# Patient Record
Sex: Female | Born: 1940 | Race: White | Hispanic: No | Marital: Married | Smoking: Never smoker
Health system: Southern US, Community
[De-identification: ages and names within clinical notes are randomized; demographics above are authoritative.]

## PROBLEM LIST (undated history)

## (undated) DIAGNOSIS — E785 Hyperlipidemia, unspecified: Secondary | ICD-10-CM

## (undated) DIAGNOSIS — I10 Essential (primary) hypertension: Secondary | ICD-10-CM

## (undated) HISTORY — PX: HEMORROIDECTOMY: SUR656

## (undated) HISTORY — PX: CHOLECYSTECTOMY: SHX55

---

## 2009-11-11 ENCOUNTER — Ambulatory Visit: Payer: Self-pay | Admitting: Emergency Medicine

## 2009-11-11 DIAGNOSIS — I10 Essential (primary) hypertension: Secondary | ICD-10-CM | POA: Insufficient documentation

## 2009-11-11 DIAGNOSIS — E785 Hyperlipidemia, unspecified: Secondary | ICD-10-CM

## 2010-02-20 ENCOUNTER — Ambulatory Visit
Admission: RE | Admit: 2010-02-20 | Discharge: 2010-02-20 | Payer: Self-pay | Source: Home / Self Care | Admitting: Family Medicine

## 2010-03-18 NOTE — Assessment & Plan Note (Signed)
Summary: R ear pain x 4 dys rm 5   Vital Signs:  Patient Profile:   70 Years Old Female CC:      R ear pain x 4 dys Height:     61.5 inches Weight:      159 pounds O2 Sat:      100 % O2 treatment:    Room Air Temp:     98.5 degrees F oral Pulse rate:   60 / minute Pulse rhythm:   regular Resp:     16 per minute BP sitting:   155 / 91  (left arm) Cuff size:   regular  Vitals Entered By: Areta Haber CMA (November 11, 2009 4:54 PM)                  Current Allergies (reviewed today): ! CODEINE      History of Present Illness Chief Complaint: R ear pain x 4 dys History of Present Illness: Patient complains of onset of cold symptoms and R ear pain for 4 days.  They have been using OTC antihistamine which is helping a little bit.  She is getting ready to fly to [place], then [place] and is concerned. 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  Eyes       Denies change in vision, eye pain, eye discharge, glasses, contact lenses, and eye surgery. Ear/Nose/Throat/Mouth       Complains of ear pain.      Denies hearing loss/aids, change  in hearing, ear discharge, dizziness, frequent runny nose, frequent nose bleeds, sinus problems, sore throat, hoarseness, and tooth pain or bleeding.      Comments: R x 4 dys Respiratory       Denies dry cough, productive cough, wheezing, shortness of breath, asthma, bronchitis, and emphysema/COPD.  Cardiovascular       Denies murmurs, chest pain, and tires easily with exhertion.    Gastrointestinal       Denies stomach pain, nausea/vomiting, diarrhea, constipation, blood in bowel movements, and indigestion. Genitourniary       Denies painful urination, kidney stones, and loss of urinary control. Neurological       Denies paralysis, seizures, and fainting/blackouts. Musculoskeletal       Denies muscle pain, joint pain, joint stiffness, decreased range of motion, redness, swelling, muscle weakness, and gout.  Skin       Denies bruising, unusual mles/lumps or sores, and hair/skin or nail changes.  Psych       Denies mood changes, temper/anger issues, anxiety/stress, speech problems, depression, and sleep problems. Other Comments: Pt has not seen her PCP for this.   Past History:  Past Medical History: Hyperlipidemia  Hypertension  Past Surgical History: Cholecystectomy Hemorrhoidectomy  Family History: Family History High cholesterol Family History Hypertension  Social History: Married Never Smoked Alcohol use-no Drug use-no Regular exercise-yes Smoking Status:  never Drug Use:  no Does Patient Exercise:  yes Physical Exam General appearance: well developed, well nourished, no acute distress Ears: bilateral ears slightly erythema behind TM, no pus, canals are normal Nasal: mucosa pink, nonedematous, no septal deviation, turbinates normal Oral/Pharynx: tongue normal, posterior pharynx without erythema or exudate Chest/Lungs: no rales, wheezes, or rhonchi bilateral, breath sounds equal without effort Heart: regular rate and  rhythm, no murmur Skin: no obvious rashes or  lesions MSE: oriented to time, place, and person Assessment New Problems: 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  Plan New Medications/Changes: AMOXICILLIN 875 MG TABS (AMOXICILLIN) 1 tab by mouth two times a day for 7 days  #14 x 0, 11/11/2009, Hoyt Koch MD  New Orders: New Patient Level II [phone] Planning Comments:   Consider an OTC decongestant, but caution because it may raise your blood pressure Hydration Chew gum while flying to help the Eustacian tube open up easier   The patient and/or caregiver has been counseled thoroughly with regard to medications prescribed including dosage, schedule, interactions, rationale for use, and possible side effects and they verbalize understanding.  Diagnoses and expected course of recovery discussed and will return if not improved as expected or if the condition worsens. Patient and/or caregiver verbalized understanding.  Prescriptions: AMOXICILLIN 875 MG TABS (AMOXICILLIN) 1 tab by mouth two times a day for 7 days  #14 x 0   Entered and Authorized by:   Hoyt Koch MD   Signed by:   Hoyt Koch MD on 11/11/2009   Method used:   Print then Give to Patient   RxID:   [id number]   Orders Added: 1)  New Patient Level II [95621]

## 2010-03-20 NOTE — Assessment & Plan Note (Signed)
Summary: FLUID IN EARS,RUNNY NOSE,SINUS PROBLEMS,COUGH/WSE (rm 4)   Vital Signs:  Patient Profile:   70 Years Old Female CC:      ear pain, congestion, cough x 1 week Height:     61.5 inches Weight:      159 pounds O2 Sat:      96 % O2 treatment:    Room Air Temp:     98.4 degrees F oral Pulse rate:   76 / minute Resp:     16 per minute BP sitting:   133 / 87  (left arm) Cuff size:   regular  Vitals Entered By: Lajean Saver RN (February 20, 2010 6:43 PM)                  Updated Prior Medication List: BENAZEPRIL HCL 40 MG TABS (BENAZEPRIL HCL) 1 tab by mouth once daily CARVEDILOL 25 MG TABS (CARVEDILOL) 2 tabs by mouth once daily SIMVASTATIN 20 MG TABS (SIMVASTATIN) 1 tab by mouth once daily ASPIRIN 81 MG TBEC (ASPIRIN) 1 tab by mouth once daily  Current Allergies (reviewed today): ! CODEINEHistory of Present Illness Chief Complaint: ear pain, congestion, cough x 1 week History of Present Illness:  Subjective: Patient complains of URI symptoms that started 6 days ago. + mild "scratchy" throat now improved + mild cough for two days No pleuritic pain No wheezing + nasal congestion + post-nasal drainage No sinus pain/pressure No itchy/red eyes ? right earache No hemoptysis No SOB No fever/chills + nausea No vomiting No abdominal pain No diarrhea No skin rashes + fatigue No myalgias No headache Used OTC meds without relief   REVIEW OF SYSTEMS Constitutional Symptoms       Complains of fatigue.     Denies fever, chills, night sweats, weight loss, and weight gain.  Eyes       Denies change in vision, eye pain, eye discharge, glasses, contact lenses, and eye surgery. Ear/Nose/Throat/Mouth       Complains of ear pain, frequent runny nose, frequent nose bleeds, and sinus problems.      Denies hearing loss/aids, change in hearing, ear discharge, dizziness, sore throat, hoarseness, and tooth pain or bleeding.      Comments: both ears Respiratory  Complains of dry cough.      Denies productive cough, wheezing, shortness of breath, asthma, bronchitis, and emphysema/COPD.  Cardiovascular       Denies murmurs, chest pain, and tires easily with exhertion.    Gastrointestinal       Denies stomach pain, nausea/vomiting, diarrhea, constipation, blood in bowel movements, and indigestion. Genitourniary       Denies painful urination, blood or discharge from vagina, kidney stones, and loss of urinary control. Neurological       Denies paralysis, seizures, and fainting/blackouts. Musculoskeletal       Denies muscle pain, joint pain, joint stiffness, decreased range of motion, redness, swelling, muscle weakness, and gout.  Skin       Denies bruising, unusual mles/lumps or sores, and hair/skin or nail changes.  Psych       Denies mood changes, temper/anger issues, anxiety/stress, speech problems, depression, and sleep problems.  Past History:  Past Medical History: Reviewed history from 11/11/2009 and no changes required. Hyperlipidemia Hypertension  Past Surgical History: Reviewed history from 11/11/2009 and no changes required. Cholecystectomy Hemorrhoidectomy  Family History: Reviewed history from 11/11/2009 and no changes required. Family History High cholesterol Family History Hypertension  Social History: Reviewed history from 11/11/2009 and no changes required.  Married Never Smoked Alcohol use-no Drug use-no Regular exercise-yes   Objective:  Appearance:  Patient appears healthy, stated age, and in no acute distress  Eyes:  Pupils are equal, round, and reactive to light and accomdation.  Extraocular movement is intact.  Conjunctivae are not inflamed.  Ears:  Canals normal.  Tympanic membranes normal.   Nose:  Normal septum.  Normal turbinates, mildly congested.   No sinus tenderness present.  Pharynx:  Normal  Neck:  Supple.  Slightly tender shotty posterior nodes are palpated bilaterally.  Lungs:  Clear to  auscultation.  Breath sounds are equal.  Heart:  Regular rate and rhythm without murmurs, rubs, or gallops.  Abdomen:  Nontender without masses or hepatosplenomegaly.  Bowel sounds are present.  No CVA or flank tenderness.  Extremities:  No edema.   Skin:  No rash Assessment New Problems: UPPER RESPIRATORY INFECTION, ACUTE (ICD-465.9)  NO EVIDENCE BACTERIAL INFECTION TODAY  Plan New Medications/Changes: BENZONATATE 200 MG CAPS (BENZONATATE) One by mouth hs as needed cough  #12 x 0, 02/20/2010, Donna Christen MD AMOXICILLIN 875 MG TABS (AMOXICILLIN) One by mouth two times a day (Rx void after 02/28/10)  #14 x 0, 02/20/2010, Donna Christen MD  New Orders: Est. Patient Level III [19147] Pulse Oximetry (single measurment) [82956] Planning Comments:   Treat symptomatically for now:  Increase fluid intake, begin expectorant, topical decongestant, cough suppressant at bedtime.  If fever/chills/sweats persist, or if not improving 5 to 7 days begin amoxicillin (given Rx to hold).  Followup with PCP if not improving 10 to 14 days.   The patient and/or caregiver has been counseled thoroughly with regard to medications prescribed including dosage, schedule, interactions, rationale for use, and possible side effects and they verbalize understanding.  Diagnoses and expected course of recovery discussed and will return if not improved as expected or if the condition worsens. Patient and/or caregiver verbalized understanding.  Prescriptions: BENZONATATE 200 MG CAPS (BENZONATATE) One by mouth hs as needed cough  #12 x 0   Entered and Authorized by:   Donna Christen MD   Signed by:   Donna Christen MD on 02/20/2010   Method used:   Print then Give to Patient   RxID:   [id number] AMOXICILLIN 875 MG TABS (AMOXICILLIN) One by mouth two times a day (Rx void after 02/28/10)  #14 x 0   Entered and Authorized by:   Donna Christen MD   Signed by:   Donna Christen MD on 02/20/2010   Method used:   Print then  Give to Patient   RxID:   [id number]   Patient Instructions: 1)  May use Mucinex  (guaifenesin) twice daily for congestion. 2)  Increase fluid intake, rest. 3)  May use Afrin nasal spray (or generic oxymetazoline) twice daily for about 5 days.  Also recommend using saline nasal spray several times daily and/or saline nasal irrigation. 4)  Begin amoxicillin if not improving 5 to 7 days or if persistent fever develops 5)  Followup with family doctor if not improving 10 to 12 days.  Orders Added: 1)  Est. Patient Level III [66440] 2)  Pulse Oximetry (single measurment) [34742]

## 2011-11-08 ENCOUNTER — Encounter: Payer: Self-pay | Admitting: *Deleted

## 2011-11-08 ENCOUNTER — Emergency Department (INDEPENDENT_AMBULATORY_CARE_PROVIDER_SITE_OTHER)
Admission: EM | Admit: 2011-11-08 | Discharge: 2011-11-08 | Disposition: A | Discharge status: Home / Self Care | Payer: Medicare Other | Source: Home / Self Care | Attending: Family Medicine | Admitting: Family Medicine

## 2011-11-08 DIAGNOSIS — N3000 Acute cystitis without hematuria: Secondary | ICD-10-CM

## 2011-11-08 HISTORY — DX: Essential (primary) hypertension: I10

## 2011-11-08 HISTORY — DX: Hyperlipidemia, unspecified: E78.5

## 2011-11-08 LAB — POCT URINALYSIS DIP (MANUAL ENTRY)
Glucose, UA: 100
Nitrite, UA: POSITIVE
Spec Grav, UA: <=1.005
Urobilinogen, UA: 0.2
pH, UA: 6

## 2011-11-08 MED ORDER — CEPHALEXIN 500 MG PO CAPS: 500.0000 mg | ORAL_CAPSULE | Freq: Two times a day (BID) | ORAL | Status: DC

## 2011-11-08 MED ORDER — PHENAZOPYRIDINE HCL 200 MG PO TABS: 200.0000 mg | ORAL_TABLET | Freq: Three times a day (TID) | ORAL | Status: DC

## 2011-11-08 NOTE — ED Notes (Signed)
Pt complains of dysuria, urinary frequency and pressure starting today.  Has taken otc AZO

## 2011-11-08 NOTE — ED Provider Notes (Signed)
History     CSN: [id number]  Arrival date & time 11/08/11  1547   First MD Initiated Contact with Patient 11/08/11 1639      Chief Complaint  Patient presents with  . Dysuria     HPI Comments: Pt complains of dysuria, urinary frequency and pressure starting today.  Has taken otc AZO  Patient is a 71 y.o. female presenting with dysuria. The history is provided by the patient.  Dysuria  This is a new problem. The current episode started 6 to 12 hours ago. The problem occurs every urination. The problem has been gradually worsening. The quality of the pain is described as burning. The pain is mild. There has been no fever. Associated symptoms include frequency, hesitancy and urgency. Pertinent negatives include no chills, no sweats, no nausea, no vomiting, no discharge, no hematuria and no flank pain.    Past Medical History  Diagnosis Date  . Hypertension   . Hyperlipemia     Past Surgical History  Procedure Date  . Hemorroidectomy   . Cholecystectomy     History reviewed. No pertinent family history.  History  Substance Use Topics  . Smoking status: Never Smoker   . Smokeless tobacco: Never Used  . Alcohol Use: No    OB History    Grav Para Term Preterm Abortions TAB SAB Ect Mult Living                  Review of Systems  Constitutional: Negative for chills.  Gastrointestinal: Negative for nausea and vomiting.  Genitourinary: Positive for dysuria, hesitancy, urgency and frequency. Negative for hematuria and flank pain.  All other systems reviewed and are negative.    Allergies  Codeine  Home Medications   Current Outpatient Rx  Name Route Sig Dispense Refill  . AMLODIPINE BESYLATE 2.5 MG PO TABS Oral Take 2.5 mg by mouth daily.    . ASPIRIN 81 MG PO TABS Oral Take 81 mg by mouth daily.    Marland Kitchen BENAZEPRIL HCL 40 MG PO TABS Oral Take 40 mg by mouth daily.    Marland Kitchen CARVEDILOL 25 MG PO TABS Oral Take 25 mg by mouth 2 (two) times daily with a meal.    .  SIMVASTATIN 20 MG PO TABS Oral Take 20 mg by mouth every evening.    . CEPHALEXIN 500 MG PO CAPS Oral Take 1 capsule (500 mg total) by mouth 2 (two) times daily. 14 capsule 0  . PHENAZOPYRIDINE HCL 200 MG PO TABS Oral Take 1 tablet (200 mg total) by mouth 3 (three) times daily. 6 tablet 0    BP 147/84  Pulse 64  Temp 97.9 F (36.6 C) (Oral)  Resp 14  Ht 5' 0.5" (1.537 m)  Wt 166 lb 12 oz (75.637 kg)  BMI 32.03 kg/m2  SpO2 96%  Physical Exam Nursing notes and Vital Signs reviewed. Appearance:  Patient appears healthy, stated age, and in no acute distress Eyes:  Pupils are equal, round, and reactive to light and accomodation.  Extraocular movement is intact.  Conjunctivae are not inflamed  Pharynx:  Normal Neck:  Supple.  No adenopathy Lungs:  Clear to auscultation.  Breath sounds are equal.  Heart:  Regular rate and rhythm without murmurs, rubs, or gallops.  Abdomen:  Nontender without masses or hepatosplenomegaly.  Bowel sounds are present.  No CVA or flank tenderness.  Extremities:  No edema.  No calf tenderness Skin:  No rash present.   ED Course  Procedures  none   Labs Reviewed  POCT URINALYSIS DIP (MANUAL ENTRY) Gluc 100mg /dL; Blood large; Prot trace; Nit positive; leuks large  URINE CULTURE pending      1. Acute cystitis       MDM  Urine culture pending. Begin Keflex, and Pyridium Increase fluid intake. Followup with Family Doctor if not improved in one week.         Lattie Haw, MD 11/13/11 1025

## 2011-11-14 ENCOUNTER — Telehealth: Payer: Self-pay | Admitting: Family Medicine

## 2012-06-16 ENCOUNTER — Encounter: Payer: Self-pay | Admitting: *Deleted

## 2012-06-16 ENCOUNTER — Emergency Department (INDEPENDENT_AMBULATORY_CARE_PROVIDER_SITE_OTHER)
Admission: EM | Admit: 2012-06-16 | Discharge: 2012-06-16 | Disposition: A | Discharge status: Home / Self Care | Payer: Medicare Other | Source: Home / Self Care | Attending: Family Medicine | Admitting: Family Medicine

## 2012-06-16 DIAGNOSIS — J029 Acute pharyngitis, unspecified: Secondary | ICD-10-CM

## 2012-06-16 DIAGNOSIS — J069 Acute upper respiratory infection, unspecified: Secondary | ICD-10-CM

## 2012-06-16 MED ORDER — BENZONATATE 200 MG PO CAPS: 200.0000 mg | ORAL_CAPSULE | Freq: Every day | ORAL | Status: DC

## 2012-06-16 MED ORDER — AMOXICILLIN 875 MG PO TABS: 875.0000 mg | ORAL_TABLET | Freq: Two times a day (BID) | ORAL | Status: DC

## 2012-06-16 NOTE — ED Provider Notes (Signed)
History     CSN: [id number]  Arrival date & time 06/16/12  1526   First MD Initiated Contact with Patient 06/16/12 1550      Chief Complaint  Patient presents with  . Facial Pain       HPI Comments: Patient complains of onset of mild sore throat two days ago that has persisted.  She next developed sinus congestion, and has just developed a cough.  She has noted facial discomfort today.  She has been fatigued, but no fevers, chills, and sweats   The history is provided by the patient.    Past Medical History  Diagnosis Date  . Hypertension   . Hyperlipemia     Past Surgical History  Procedure Laterality Date  . Hemorroidectomy    . Cholecystectomy      Family History  Problem Relation Age of Onset  . Heart attack Mother   . Heart attack Father   . Cancer Sister     breast  . Cancer Brother     lung    History  Substance Use Topics  . Smoking status: Never Smoker   . Smokeless tobacco: Never Used  . Alcohol Use: No    OB History   Grav Para Term Preterm Abortions TAB SAB Ect Mult Living                  Review of Systems + sore throat + cough No pleuritic pain No wheezing + nasal congestion + post-nasal drainage + sinus pain/pressure No itchy/red eyes ? earache No hemoptysis No SOB No fever/chills No nausea No vomiting No abdominal pain No diarrhea No urinary symptoms No skin rashes + fatigue No myalgias + headache Used OTC meds without relief  Allergies  Codeine  Home Medications   Current Outpatient Rx  Name  Route  Sig  Dispense  Refill  . aspirin 81 MG tablet   Oral   Take 81 mg by mouth daily.         Marland Kitchen atorvastatin (LIPITOR) 40 MG tablet   Oral   Take 40 mg by mouth daily.         . benazepril (LOTENSIN) 40 MG tablet   Oral   Take 40 mg by mouth daily.         . carvedilol (COREG) 25 MG tablet   Oral   Take 25 mg by mouth 2 (two) times daily with a meal.         . clopidogrel (PLAVIX) 75 MG tablet   Oral   Take 75 mg by mouth daily.         . phenazopyridine (PYRIDIUM) 200 MG tablet   Oral   Take 1 tablet (200 mg total) by mouth 3 (three) times daily.   6 tablet   0   . simvastatin (ZOCOR) 20 MG tablet   Oral   Take 20 mg by mouth every evening.         Marland Kitchen amoxicillin (AMOXIL) 875 MG tablet   Oral   Take 1 tablet (875 mg total) by mouth 2 (two) times daily. (Rx void after 06/24/12)   20 tablet   0   . benzonatate (TESSALON) 200 MG capsule   Oral   Take 1 capsule (200 mg total) by mouth at bedtime.   12 capsule   0     BP 131/85  Pulse 75  Temp(Src) 98.4 F (36.9 C) (Oral)  Resp 14  Ht 5\' 1"  (1.549 m)  Wt  162 lb (73.483 kg)  BMI 30.63 kg/m2  SpO2 95%  Physical Exam Nursing notes and Vital Signs reviewed. Appearance:  Patient appears healthy, stated age, and in no acute distress Eyes:  Pupils are equal, round, and reactive to light and accomodation.  Extraocular movement is intact.  Conjunctivae are not inflamed  Ears:  Canals normal.  Tympanic membranes normal.  Nose:  Mildly congested turbinates.   Mild maxillary sinus tenderness is present.  Pharynx:   Mildly erythematous posteriorly Neck:  Supple.  Slightly tender shotty posterior nodes are palpated bilaterally  Lungs:  Clear to auscultation.  Breath sounds are equal.  Heart:  Regular rate and rhythm without murmurs, rubs, or gallops.  Abdomen:  Nontender without masses or hepatosplenomegaly.  Bowel sounds are present.  No CVA or flank tenderness.  Extremities:  No edema.  No calf tenderness Skin:  No rash present.   ED Course  Procedures  none  Labs Reviewed  POCT RAPID STREP A (OFFICE) negative      1. Acute upper respiratory infections of unspecified site   2. Acute pharyngitis       MDM  There is no evidence of bacterial infection today.  Throat culture pending. Treat symptomatically for now.  Prescription written for Benzonatate [facility]) to take at bedtime for night-time cough.  Take plain  Mucinex (guaifenesin) twice daily for cough and congestion.  Increase fluid intake, rest. May use Afrin nasal spray (or generic oxymetazoline) twice daily for about 5 days.  Also recommend using saline nasal spray several times daily and saline nasal irrigation (AYR is a common brand) Stop all antihistamines for now, and other non-prescription cough/cold preparations. Begin Amoxicillin if not improving about one week or if persistent fever develops (Given a prescription to hold, with an expiration date)  Follow-up with family doctor if not improving about 10 days.         Lattie Haw, MD 06/16/12 [phone]

## 2012-06-16 NOTE — ED Notes (Signed)
Sheena Allen c/o sinus pain, congestion, HA and runny nose x 3 days. Denies fever. NO otc meds taken.

## 2014-07-21 ENCOUNTER — Emergency Department (INDEPENDENT_AMBULATORY_CARE_PROVIDER_SITE_OTHER)
Admission: EM | Admit: 2014-07-21 | Discharge: 2014-07-21 | Disposition: A | Discharge status: Home / Self Care | Payer: Medicare Other | Source: Home / Self Care | Attending: Sports Medicine | Admitting: Sports Medicine

## 2014-07-21 ENCOUNTER — Encounter: Payer: Self-pay | Admitting: Emergency Medicine

## 2014-07-21 DIAGNOSIS — N3090 Cystitis, unspecified without hematuria: Secondary | ICD-10-CM | POA: Diagnosis not present

## 2014-07-21 MED ORDER — CEPHALEXIN 500 MG PO CAPS: 500.0000 mg | ORAL_CAPSULE | Freq: Two times a day (BID) | ORAL | Status: DC

## 2014-07-21 NOTE — ED Provider Notes (Signed)
  Subjective:    CC: Dysuria  HPI: This is an extremely pleasant 74 year old female, who comes in with a one-day history of dysuria and urgency, no fevers, chills, night sweats, no flank pain, no visible hematuria. No GI symptoms. No other constitutional symptoms. She has had UTIs before, previously they have grown out pansensitive Escherichia coli. This feels like her typical UTI symptoms.   Past medical history, Surgical history, Family history not pertinant except as noted below, Social history, Allergies, and medications have been entered into the medical record, reviewed, and no changes needed.   Review of Systems: No fevers, chills, night sweats, weight loss, chest pain, or shortness of breath.   Objective:    General: Well Developed, well nourished, and in no acute distress.  Neuro: Alert and oriented x3, extra-ocular muscles intact, sensation grossly intact.  HEENT: Normocephalic, atraumatic, pupils equal round reactive to light, neck supple, no masses, no lymphadenopathy, thyroid nonpalpable.  Skin: Warm and dry, no rashes. Cardiac: Regular rate and rhythm, no murmurs rubs or gallops, no lower extremity edema.  Respiratory: Clear to auscultation bilaterally. Not using accessory muscles, speaking in full sentences. Abdomen: Soft, nontender, nondistended, normal bowel sounds, no palpable masses, no costovertebral angle pain.  Urinalysis deferred due to concurrent Pyridium, urine culture has been sent off.  Impression and Recommendations:     1. Cystitis: Uncomplicated, no allergies, previous urinary tract infections of grown out pansensitive Escherichia coli, treated with Keflex twice a day for 7 days, continue Pyridium. Hydration, return as needed.  Monica Bectonhomas J Thekkekandam, MD 07/21/14 1719

## 2014-07-21 NOTE — ED Notes (Signed)
Pt c/o urinary frequency, urgency and burning that started this am. No back pain denies fever. She is taking azo.

## 2014-07-22 LAB — URINE CULTURE
Colony Count: NO GROWTH
Organism ID, Bacteria: NO GROWTH

## 2014-11-27 ENCOUNTER — Emergency Department (INDEPENDENT_AMBULATORY_CARE_PROVIDER_SITE_OTHER)
Admission: EM | Admit: 2014-11-27 | Discharge: 2014-11-27 | Disposition: A | Discharge status: Home / Self Care | Payer: Medicare Other | Source: Home / Self Care | Attending: Family Medicine | Admitting: Family Medicine

## 2014-11-27 ENCOUNTER — Encounter: Payer: Self-pay | Admitting: *Deleted

## 2014-11-27 DIAGNOSIS — K120 Recurrent oral aphthae: Secondary | ICD-10-CM

## 2014-11-27 DIAGNOSIS — J069 Acute upper respiratory infection, unspecified: Secondary | ICD-10-CM

## 2014-11-27 LAB — POCT RAPID STREP A (OFFICE): RAPID STREP A SCREEN: NEGATIVE

## 2014-11-27 MED ORDER — TRIAMCINOLONE ACETONIDE 0.1 % MT PSTE: PASTE | OROMUCOSAL | Status: AC

## 2014-11-27 MED ORDER — AMOXICILLIN 875 MG PO TABS: 875.0000 mg | ORAL_TABLET | Freq: Two times a day (BID) | ORAL | Status: DC

## 2014-11-27 MED ORDER — BENZONATATE 200 MG PO CAPS: 200.0000 mg | ORAL_CAPSULE | Freq: Every day | ORAL | Status: DC

## 2014-11-27 NOTE — ED Provider Notes (Signed)
CSN: [id number]     Arrival date & time 11/27/14  1500 History   First MD Initiated Contact with Patient 11/27/14 1546     Chief Complaint  Patient presents with  . Facial Swelling  . Nasal Congestion     HPI Comments: Yesterday patient developed sinus congestion and left facial swelling, sore throat, fatigue, myalgias, nausea, and soreness in the roof of her mouth.  No cough.  No fevers, chills, and sweats.  The history is provided by the patient.    Past Medical History  Diagnosis Date  . Hypertension   . Hyperlipemia    Past Surgical History  Procedure Laterality Date  . Hemorroidectomy    . Cholecystectomy     Family History  Problem Relation Age of Onset  . Heart attack Mother   . Heart attack Father   . Cancer Sister     breast  . Cancer Brother     lung   Social History  Substance Use Topics  . Smoking status: Never Smoker   . Smokeless tobacco: Never Used  . Alcohol Use: No   OB History    No data available     Review of Systems  Allergies  Codeine  Home Medications   Prior to Admission medications   Medication Sig Start Date End Date Taking? Authorizing Provider  aspirin 81 MG tablet Take 81 mg by mouth daily.   Yes Historical Provider, MD  atorvastatin (LIPITOR) 40 MG tablet Take 40 mg by mouth daily.   Yes Historical Provider, MD  benazepril (LOTENSIN) 40 MG tablet Take 40 mg by mouth daily.   Yes Historical Provider, MD  clopidogrel (PLAVIX) 75 MG tablet Take 75 mg by mouth daily.   Yes Historical Provider, MD  diltiazem (DILACOR XR) 180 MG 24 hr capsule Take 180 mg by mouth daily.   Yes Historical Provider, MD  simvastatin (ZOCOR) 20 MG tablet Take 20 mg by mouth every evening.   Yes Historical Provider, MD  amoxicillin (AMOXIL) 875 MG tablet Take 1 tablet (875 mg total) by mouth 2 (two) times daily. (Rx void after 12/05/14) 11/27/14   Lattie Haw, MD  benzonatate (TESSALON) 200 MG capsule Take 1 capsule (200 mg total) by mouth at bedtime.  Take as needed for cough 11/27/14   Lattie Haw, MD  triamcinolone (KENALOG) 0.1 % paste Place thin layer on mouth ulcers four times daily 11/27/14   Lattie Haw, MD   Meds Ordered and Administered this Visit  Medications - No data to display  BP 163/107 mmHg  Pulse 76  Temp(Src) 98.3 F (36.8 C) (Oral)  Resp 18  Ht 5' 1.5" (1.562 m)  Wt 162 lb (73.483 kg)  BMI 30.12 kg/m2  SpO2 98% No data found.   Physical Exam Nursing notes and Vital Signs reviewed. Appearance:  Patient appears stated age, and in no acute distress Eyes:  Pupils are equal, round, and reactive to light and accomodation.  Extraocular movement is intact.  Conjunctivae are not inflamed  Ears:  Canals normal.  Tympanic membranes normal.  Nose:  Mildly congested turbinates.  No sinus tenderness.  Mouth:  8mm diameter shallow ulcer with surrounding erythema left hard palate adjacent to gingiva  Pharynx:  Mildly erythematous Neck:  Supple.   Tonsillar nodes are slightly tender but not enlarged; posterior nodes are enlarged and tender bilaterally  Lungs:  Clear to auscultation.  Breath sounds are equal.  Moving air well. Heart:  Regular rate and rhythm without  murmurs, rubs, or gallops.  Abdomen:  Nontender without masses or hepatosplenomegaly.  Bowel sounds are present.  No CVA or flank tenderness.  Extremities:  No edema.  No calf tenderness Skin:  No rash present.   ED Course  Procedures  None    Labs Reviewed  POCT RAPID STREP A (OFFICE) negative    MDM   1. Viral URI, early   2. Aphthous ulcer    Rx for Kenalog in [place].  Prescription written for Benzonatate [facility]) to take at bedtime for night-time cough.  If cough develops, take plain guaifenesin (  extended release tabs such as Mucinex) twice daily, with plenty of water, for cough and congestion. Get adequate rest.    Also recommend using saline nasal spray several times daily and saline nasal irrigation (AYR is a common brand).  May  use Flonase nasal spray each morning after using Afrin nasal spray and saline nasal irrigation. Try warm salt water gargles for sore throat.  Stop all antihistamines for now, and other non-prescription cough/cold preparations. Begin Amoxicillin if not improving about one week or if persistent fever develops (Given a prescription to hold, with an expiration date)  Follow-up with family doctor if not improving about10 days.    Lattie Haw, MD 12/02/14 [phone]

## 2014-11-27 NOTE — Discharge Instructions (Signed)
If cough developes, take plain guaifenesin (  extended release tabs such as Mucinex) twice daily, with plenty of water, for cough and congestion. Get adequate rest.    Also recommend using saline nasal spray several times daily and saline nasal irrigation (AYR is a common brand).  May use Flonase nasal spray each morning after using Afrin nasal spray and saline nasal irrigation. Try warm salt water gargles for sore throat.  Stop all antihistamines for now, and other non-prescription cough/cold preparations. Begin Amoxicillin if not improving about one week or if persistent fever develops (Given a prescription to hold, with an expiration date)  Follow-up with family doctor if not improving about10 days.    Canker Sores Canker sores are small, painful sores that develop inside your mouth. They may also be called aphthous ulcers. You can get canker sores on the inside of your lips or cheeks, on your tongue, or anywhere inside your mouth. You can have just one canker sore or several of them. Canker sores cannot be passed from one person to another (noncontagious). These sores are different than the sores that you may get on the outside of your lips (cold sores or fever blisters). Canker sores usually start as painful red bumps. Then they turn into small white, yellow, or gray ulcers that have red borders. The ulcers may be quite painful. The pain may be worse when you eat or drink. CAUSES The cause of this condition is not known. RISK FACTORS This condition is more likely to develop in:  Women.  People in their teens or 54s.  Women who are having their menstrual period.  People who are under a lot of emotional stress.  People who do not get enough iron or B vitamins.  People who have poor oral hygiene.  People who have an injury inside the mouth. This can happen after having dental work or from chewing something hard. SYMPTOMS Along with the canker sore, symptoms may also  include:  Fever.  Fatigue.  Swollen lymph nodes in your neck. DIAGNOSIS This condition can be diagnosed based on your symptoms. Your health care provider will also examine your mouth. Your health care provider may also do tests if you get canker sores often or if they are very bad. Tests may include:  Blood tests to rule out other causes of canker sores.  Taking swabs from the sore to check for infection.  Taking a small piece of skin from the sore (biopsy) to test it for cancer. TREATMENT Most canker sores clear up without treatment in about 10 days. Home care is usually the only treatment that you will need. Over-the-counter medicines can relieve discomfort.If you have severe canker sores, your health care provider may prescribe:  Numbing ointment to relieve pain.  Vitamins.  Steroid medicines. These may be given as:  Oral pills.  Mouth rinses.  Gels.  Antibiotic mouth rinse. HOME CARE INSTRUCTIONS  Apply, take, or use medicines only as directed by your health care provider. These include vitamins.  If you were prescribed an antibiotic mouth rinse, finish all of it even if you start to feel better.  Until the sores are healed:  Do not drink coffee or citrus juices.  Do not eat spicy or salty foods.  Use a mild, over-the-counter mouth rinse as directed by your health care provider.  Practice good oral hygiene.  Floss your teeth every day.  Brush your teeth with a soft brush twice each day. SEEK MEDICAL CARE IF:  Your symptoms do  not get better after two weeks.  You also have a fever or swollen glands.  You get canker sores often.  You have a canker sore that is getting larger.  You cannot eat or drink due to your canker sores.   This information is not intended to replace advice given to you by your health care provider. Make sure you discuss any questions you have with your health care provider.   Document Released: 05/30/2010 Document Revised:  06/19/2014 Document Reviewed: 01/03/2014 Elsevier Interactive Patient Education [employer]

## 2014-11-27 NOTE — ED Notes (Signed)
Pt c/o LT sided facial swelling, oral swelling, and nasal congestion x 1 day. Denies fever.

## 2015-09-04 ENCOUNTER — Emergency Department (INDEPENDENT_AMBULATORY_CARE_PROVIDER_SITE_OTHER)
Admission: EM | Admit: 2015-09-04 | Discharge: 2015-09-04 | Disposition: A | Discharge status: Home / Self Care | Payer: Medicare Other | Source: Home / Self Care | Attending: Family Medicine | Admitting: Family Medicine

## 2015-09-04 DIAGNOSIS — Z87440 Personal history of urinary (tract) infections: Secondary | ICD-10-CM | POA: Diagnosis not present

## 2015-09-04 DIAGNOSIS — R300 Dysuria: Secondary | ICD-10-CM

## 2015-09-04 MED ORDER — CEPHALEXIN 500 MG PO CAPS: 500.0000 mg | ORAL_CAPSULE | Freq: Two times a day (BID) | ORAL | Status: DC

## 2015-09-04 NOTE — ED Notes (Signed)
Started this afternoon with burning on urination, and bladder pressure.  Denies lower back pain.  Took AZO this afternoon.

## 2015-09-04 NOTE — Discharge Instructions (Signed)
You may continue to take over the counter Azo to help with urinary symptoms. Be sure to stay well hydrated.  Also be sure to follow up with urology, gynecologist, or primary care if you keep having recurrent urinary infections, more than 1 per year.

## 2015-09-04 NOTE — ED Notes (Signed)
BP retaken 177/95

## 2015-09-04 NOTE — ED Provider Notes (Signed)
CSN: [id number]     Arrival date & time 09/04/15  1754 History   First MD Initiated Contact with Patient 09/04/15 1826     Chief Complaint  Patient presents with  . Dysuria   (Consider location/radiation/quality/duration/timing/severity/associated sxs/prior Treatment) HPI Sheena Allen is a 75 y.o. female presenting to UC with c/o sudden onset urinary symptoms including burning with urination, bladder pressure and urgency.  Hx of UTI about 1 year ago with similar symptoms. She did take 1 dose of Azo PTA with mild relief. Denies noticing hematuria prior to taking Azo.  Denies nausea, fever, chills, or low back pain.    BP elevated in triage. Hx of HTN. Denies headache, chest pain or SOB. Notes she typically takes her BP medication in the evening and has not taken that dose yet as she knew she was coming to UC.    Past Medical History  Diagnosis Date  . Hypertension   . Hyperlipemia    Past Surgical History  Procedure Laterality Date  . Hemorroidectomy    . Cholecystectomy     Family History  Problem Relation Age of Onset  . Heart attack Mother   . Heart attack Father   . Cancer Sister     breast  . Cancer Brother     lung   Social History  Substance Use Topics  . Smoking status: Never Smoker   . Smokeless tobacco: Never Used  . Alcohol Use: No   OB History    No data available     Review of Systems  Constitutional: Negative for fever, chills and fatigue.  Gastrointestinal: Positive for abdominal pain ( "bladder pressure"). Negative for nausea, vomiting and diarrhea.  Genitourinary: Positive for dysuria, urgency, frequency and pelvic pain ( "pressure"). Negative for hematuria and flank pain.  Neurological: Negative for dizziness and light-headedness.    Allergies  Codeine  Home Medications   Prior to Admission medications   Medication Sig Start Date End Date Taking? Authorizing Provider  amoxicillin (AMOXIL) 875 MG tablet Take 1 tablet (875 mg total) by mouth 2  (two) times daily. (Rx void after 12/05/14) 11/27/14   Lattie HawStephen A Beese, MD  aspirin 81 MG tablet Take 81 mg by mouth daily.    Historical Provider, MD  atorvastatin (LIPITOR) 40 MG tablet Take 40 mg by mouth daily.    Historical Provider, MD  benazepril (LOTENSIN) 40 MG tablet Take 40 mg by mouth daily.    Historical Provider, MD  benzonatate (TESSALON) 200 MG capsule Take 1 capsule (200 mg total) by mouth at bedtime. Take as needed for cough 11/27/14   Lattie HawStephen A Beese, MD  cephALEXin (KEFLEX) 500 MG capsule Take 1 capsule (500 mg total) by mouth 2 (two) times daily. For 7 days 09/04/15   Junius FinnerErin O'Malley, PA-C  clopidogrel (PLAVIX) 75 MG tablet Take 75 mg by mouth daily.    Historical Provider, MD  diltiazem (DILACOR XR) 180 MG 24 hr capsule Take 180 mg by mouth daily.    Historical Provider, MD  simvastatin (ZOCOR) 20 MG tablet Take 20 mg by mouth every evening.    Historical Provider, MD  triamcinolone (KENALOG) 0.1 % paste Place thin layer on mouth ulcers four times daily 11/27/14   Lattie HawStephen A Beese, MD   Meds Ordered and Administered this Visit  Medications - No data to display  BP 176/104 mmHg  Pulse 70  Temp(Src) 97.7 F (36.5 C) (Oral)  Ht 5' 1.5" (1.562 m)  Wt 169 lb 6.4 oz (76.839  kg)  BMI 31.49 kg/m2  SpO2 96% No data found.   Physical Exam  Constitutional: She appears well-developed and well-nourished. No distress.  HENT:  Head: Normocephalic and atraumatic.  Mouth/Throat: Oropharynx is clear and moist.  Eyes: Conjunctivae are normal. No scleral icterus.  Neck: Normal range of motion.  Cardiovascular: Normal rate, regular rhythm and normal heart sounds.   Pulmonary/Chest: Effort normal and breath sounds normal. No respiratory distress. She has no wheezes. She has no rales.  Abdominal: Soft. She exhibits no distension and no mass. There is no tenderness. There is no rebound, no guarding and no CVA tenderness.  Musculoskeletal: Normal range of motion.  Neurological: She is  alert.  Skin: Skin is warm and dry. She is not diaphoretic.  Nursing note and vitals reviewed.   ED Course  Procedures (including critical care time)  Labs Review Labs Reviewed  URINE CULTURE    Imaging Review No results found.    MDM   1. Dysuria   2. History of UTI    Pt c/o UTI symptoms that started this afternoon, hx of same. Pt started taking Azo PTA.  UA could be altered, will start empiric treatment and send urine culture.  Recheck BP- 177/95. Encouraged to take her BP medication when she gets home and f/u with PCP for monitoring.  Rx: Keflex Encouraged to keep well hydrated. F/u with PCP in 4-5 days if not improving and for recurrent UTIs if becoming more frequent than once a year. Patient verbalized understanding and agreement with treatment plan.     Junius Finner, PA-C 09/04/15 1928

## 2015-09-04 NOTE — ED Notes (Signed)
Bed: KUC3 Expected date:  Expected time:  Means of arrival:  Comments: OCC

## 2015-09-05 LAB — URINE CULTURE

## 2015-09-06 ENCOUNTER — Telehealth: Payer: Self-pay

## 2015-09-06 NOTE — Telephone Encounter (Signed)
Pt stated that she is feeling much better.  Will follow up with PCP if questions or problems.

## 2017-10-22 ENCOUNTER — Other Ambulatory Visit: Payer: Self-pay

## 2017-10-22 ENCOUNTER — Emergency Department (INDEPENDENT_AMBULATORY_CARE_PROVIDER_SITE_OTHER)
Admission: EM | Admit: 2017-10-22 | Discharge: 2017-10-22 | Disposition: A | Discharge status: Home / Self Care | Payer: Medicare Other | Source: Home / Self Care | Attending: Family Medicine | Admitting: Family Medicine

## 2017-10-22 ENCOUNTER — Encounter: Payer: Self-pay | Admitting: Emergency Medicine

## 2017-10-22 DIAGNOSIS — R21 Rash and other nonspecific skin eruption: Secondary | ICD-10-CM | POA: Diagnosis not present

## 2017-10-22 MED ORDER — PREDNISONE 50 MG PO TABS: 50.0000 mg | ORAL_TABLET | Freq: Every day | ORAL | 0 refills | Status: AC

## 2017-10-22 MED ORDER — TRIAMCINOLONE ACETONIDE 0.1 % EX CREA: 1.0000 "application " | TOPICAL_CREAM | Freq: Two times a day (BID) | CUTANEOUS | 0 refills | Status: AC

## 2017-10-22 NOTE — ED Provider Notes (Signed)
[facility]    CSN: [id number] Arrival date & time: 10/22/17  1025     History   Chief Complaint Chief Complaint  Patient presents with  . Rash    HPI Sheena Allen is a 77 y.o. female.   HPI Sheena Allen is a 77 y.o. female presenting to UC with c/o itching red, slightly burning rash to Right upper chest that started 5 days ago.  She had a lumpectomy performed on 10/11/17.  She has since followed up with her surgeon who believes the rash is due to the cleaning agent used on her skin. Pt has been taking benadryl and using cortisone cream with moderate improvement but the rash is still present. Pt wants to make sure it is not shingles. Denies fever, chills, body aches. No bleeding or drainage from the rash.    Past Medical History:  Diagnosis Date  . Hyperlipemia   . Hypertension     Patient Active Problem List   Diagnosis Date Noted  . HYPERLIPIDEMIA 11/11/2009  . HYPERTENSION 11/11/2009    Past Surgical History:  Procedure Laterality Date  . CHOLECYSTECTOMY    . HEMORROIDECTOMY      OB History   None      Home Medications    Prior to Admission medications   Medication Sig Start Date End Date Taking? Authorizing Provider  losartan (COZAAR) 100 MG tablet Take 100 mg by mouth daily.   Yes [provider]  amoxicillin (AMOXIL) 875 MG tablet Take 1 tablet (875 mg total) by mouth 2 (two) times daily. (Rx void after 12/05/14) 11/27/14   Lattie Haw, MD  aspirin 81 MG tablet Take 81 mg by mouth daily.    [provider]  atorvastatin (LIPITOR) 40 MG tablet Take 40 mg by mouth daily.    [provider]  benazepril (LOTENSIN) 40 MG tablet Take 40 mg by mouth daily.    [provider]  benzonatate (TESSALON) 200 MG capsule Take 1 capsule (200 mg total) by mouth at bedtime. Take as needed for cough 11/27/14   Lattie Haw, MD  cephALEXin (KEFLEX) 500 MG capsule Take 1 capsule (500 mg total) by mouth 2 (two) times  daily. For 7 days 09/04/15   Lurene Shadow, PA-C  clopidogrel (PLAVIX) 75 MG tablet Take 75 mg by mouth daily.    [provider]  diltiazem (DILACOR XR) 180 MG 24 hr capsule Take 180 mg by mouth daily.    [provider]  predniSONE (DELTASONE) 50 MG tablet Take 1 tablet (50 mg total) by mouth daily with breakfast for 5 days. 10/22/17 10/27/17  Lurene Shadow, PA-C  simvastatin (ZOCOR) 20 MG tablet Take 20 mg by mouth every evening.    [provider]  triamcinolone (KENALOG) 0.1 % paste Place thin layer on mouth ulcers four times daily 11/27/14   Lattie Haw, MD  triamcinolone cream (KENALOG) 0.1 % Apply 1 application topically 2 (two) times daily. 10/22/17   Lurene Shadow, PA-C    Family History Family History  Problem Relation Age of Onset  . Heart attack Mother   . Heart attack Father   . Cancer Sister        breast  . Cancer Brother        lung    Social History Social History   Tobacco Use  . Smoking status: Never Smoker  . Smokeless tobacco: Never Used  Substance Use Topics  . Alcohol use: No  .  Drug use: No     Allergies   Codeine   Review of Systems Review of Systems  Constitutional: Negative for chills and fever.  Musculoskeletal: Negative for arthralgias, joint swelling and myalgias.  Skin: Positive for color change and rash. Negative for wound.     Physical Exam Triage Vital Signs ED Triage Vitals  Enc Vitals Group     BP 10/22/17 1041 (!) 142/88     Pulse Rate 10/22/17 1041 74     Resp 10/22/17 1041 16     Temp 10/22/17 1041 98.4 F (36.9 C)     Temp Source 10/22/17 1041 Oral     SpO2 10/22/17 1041 99 %     Weight 10/22/17 1042 159 lb (72.1 kg)     Height 10/22/17 1042 5\' 1"  (1.549 m)     Head Circumference --      Peak Flow --      Pain Score 10/22/17 1042 0     Pain Loc --      Pain Edu? --      Excl. in GC? --    No data found.  Updated Vital Signs BP (!) 142/88 (BP Location: Left Arm)   Pulse 74   Temp  98.4 F (36.9 C) (Oral)   Resp 16   Ht 5\' 1"  (1.549 m)   Wt 159 lb (72.1 kg)   SpO2 99%   BMI 30.04 kg/m   Visual Acuity Right Eye Distance:   Left Eye Distance:   Bilateral Distance:    Right Eye Near:   Left Eye Near:    Bilateral Near:     Physical Exam  Constitutional: She is oriented to person, place, and time. She appears well-developed and well-nourished. No distress.  HENT:  Head: Normocephalic and atraumatic.  Eyes: EOM are normal.  Neck: Normal range of motion.  Cardiovascular: Normal rate.  Pulmonary/Chest: Effort normal.  Musculoskeletal: Normal range of motion.  Neurological: She is alert and oriented to person, place, and time.  Skin: Skin is warm and dry. Rash noted. She is not diaphoretic. There is erythema.     Right side upper chest: faint erythematous papular rash. It does cross midline. Non-tender. No induration or fluctuance. No bleeding or discharge. No vesicles.   Psychiatric: She has a normal mood and affect. Her behavior is normal.  Nursing note and vitals reviewed.    UC Treatments / Results  Labs (all labs ordered are listed, but only abnormal results are displayed) Labs Reviewed - No data to display  EKG None  Radiology No results found.  Procedures Procedures (including critical care time)  Medications Ordered in UC Medications - No data to display  Initial Impression / Assessment and Plan / UC Course  I have reviewed the triage vital signs and the nursing notes.  Pertinent labs & imaging results that were available during my care of the patient were reviewed by me and considered in my medical decision making (see chart for details).     Hx and exam most c/w contact dermatitis. No evidence of underlying infection. Reassured pt rash is Not c/w shingles.  Offered triamcinolone cream and oral prednisone.  Pt would like to continue current benadryl and cortisone but requested prescriptions be printed for triamcinolone cream and  prednisone in case she does decide to change home treatment.    Final Clinical Impressions(s) / UC Diagnoses   Final diagnoses:  Rash and nonspecific skin eruption     Discharge Instructions  You may continue taking over the counter benadryl and using the cortisone cream, however, if the benadryl is making you too fatigued, you may try over the counter Claritin or Zyrtec.  If itching is not improving or you would like better itch relief, you may try the prescribed triamcinolone cream and start the oral prednisone to help with skin irritation, inflammation and itching.   Please follow up with family medicine in 1 week if not improving, sooner if worsening.     ED Prescriptions    Medication Sig Dispense Auth. Provider   triamcinolone cream (KENALOG) 0.1 % Apply 1 application topically 2 (two) times daily. 30 g Doroteo Glassman, Annmarie Plemmons O, PA-C   predniSONE (DELTASONE) 50 MG tablet Take 1 tablet (50 mg total) by mouth daily with breakfast for 5 days. 5 tablet Lurene Shadow, PA-C     Controlled Substance Prescriptions [place] Controlled Substance Registry consulted? Not Applicable   Rolla Plate 10/22/17 1208

## 2017-10-22 NOTE — ED Triage Notes (Signed)
Patient reports itchy rash on right side of chest and under arm, some on right arm. She had lumpectomy surgery on this side 10/11/17.

## 2017-10-22 NOTE — Discharge Instructions (Signed)
°  You may continue taking over the counter benadryl and using the cortisone cream, however, if the benadryl is making you too fatigued, you may try over the counter Claritin or Zyrtec.  If itching is not improving or you would like better itch relief, you may try the prescribed triamcinolone cream and start the oral prednisone to help with skin irritation, inflammation and itching.   Please follow up with family medicine in 1 week if not improving, sooner if worsening.

## 2018-01-21 ENCOUNTER — Other Ambulatory Visit: Payer: Self-pay

## 2018-01-21 ENCOUNTER — Emergency Department (INDEPENDENT_AMBULATORY_CARE_PROVIDER_SITE_OTHER)
Admission: EM | Admit: 2018-01-21 | Discharge: 2018-01-21 | Disposition: A | Discharge status: Home / Self Care | Payer: Medicare Other | Source: Home / Self Care | Attending: Family Medicine | Admitting: Family Medicine

## 2018-01-21 ENCOUNTER — Encounter: Payer: Self-pay | Admitting: *Deleted

## 2018-01-21 DIAGNOSIS — B9789 Other viral agents as the cause of diseases classified elsewhere: Secondary | ICD-10-CM | POA: Diagnosis not present

## 2018-01-21 DIAGNOSIS — J069 Acute upper respiratory infection, unspecified: Secondary | ICD-10-CM

## 2018-01-21 MED ORDER — DOXYCYCLINE HYCLATE 100 MG PO CAPS: 100.0000 mg | ORAL_CAPSULE | Freq: Two times a day (BID) | ORAL | 0 refills | Status: DC

## 2018-01-21 MED ORDER — BENZONATATE 200 MG PO CAPS: ORAL_CAPSULE | ORAL | 0 refills | Status: DC

## 2018-01-21 NOTE — Discharge Instructions (Addendum)
Take plain guaifenesin (1200mg  extended release tabs such as Mucinex) twice daily, with plenty of water, for cough and congestion.  Get adequate rest.   May use Afrin nasal spray (or generic oxymetazoline) each morning for about 5 days and then discontinue.  Also recommend using saline nasal spray several times daily and saline nasal irrigation (AYR is a common brand).   Try warm salt water gargles for sore throat.  Stop all antihistamines for now, and other non-prescription cough/cold preparations. May take Tylenol as needed for fever, headache, etc. May take Delsym Cough Suppressant with Tessalon at bedtime for nighttime cough.  Begin Doxycycline if not improving about one week or if persistent fever develops

## 2018-01-21 NOTE — ED Triage Notes (Signed)
Pt c/o productive cough, nasal congestion, sore throat, and sinus pressure x 1 wk. Denies fever. She has taken vit C, airborne, and Zicam.

## 2018-01-21 NOTE — ED Provider Notes (Signed)
[facility]    CSN: [id number] Arrival date & time: 01/21/18  1150     History   Chief Complaint Chief Complaint  Patient presents with  . Cough    HPI Sheena Allen is a 77 y.o. female.   One week ago patient developed typical cold-like symptoms developing over several days, including mild sore throat, sinus congestion, headache, myalgias, and fatigue.  She developed a cough 3 days ago.  The history is provided by the patient.    Past Medical History:  Diagnosis Date  . Hyperlipemia   . Hypertension     Patient Active Problem List   Diagnosis Date Noted  . HYPERLIPIDEMIA 11/11/2009  . HYPERTENSION 11/11/2009    Past Surgical History:  Procedure Laterality Date  . CHOLECYSTECTOMY    . HEMORROIDECTOMY      OB History   None      Home Medications    Prior to Admission medications   Medication Sig Start Date End Date Taking? Authorizing Provider  aspirin 81 MG tablet Take 81 mg by mouth daily.    [provider]  atorvastatin (LIPITOR) 40 MG tablet Take 40 mg by mouth daily.    [provider]  benazepril (LOTENSIN) 40 MG tablet Take 40 mg by mouth daily.    [provider]  benzonatate (TESSALON) 200 MG capsule Take one cap by mouth at bedtime as needed for cough.  May repeat in 4 to 6 hours 01/21/18   Lattie Haw, MD  clopidogrel (PLAVIX) 75 MG tablet Take 75 mg by mouth daily.    [provider]  diltiazem (DILACOR XR) 180 MG 24 hr capsule Take 180 mg by mouth daily.    [provider]  doxycycline (VIBRAMYCIN) 100 MG capsule Take 1 capsule (100 mg total) by mouth 2 (two) times daily. Take with food (Rx void after 01/30/18) 01/21/18   Lattie Haw, MD  losartan (COZAAR) 100 MG tablet Take 100 mg by mouth daily.    [provider]  simvastatin (ZOCOR) 20 MG tablet Take 20 mg by mouth every evening.    [provider]  triamcinolone (KENALOG) 0.1 % paste Place thin layer on  mouth ulcers four times daily 11/27/14   Lattie Haw, MD  triamcinolone cream (KENALOG) 0.1 % Apply 1 application topically 2 (two) times daily. 10/22/17   Lurene Shadow, PA-C    Family History Family History  Problem Relation Age of Onset  . Heart attack Mother   . Heart attack Father   . Cancer Sister        breast  . Cancer Brother        lung    Social History Social History   Tobacco Use  . Smoking status: Never Smoker  . Smokeless tobacco: Never Used  Substance Use Topics  . Alcohol use: No  . Drug use: No     Allergies   Codeine   Review of Systems Review of Systems + sore throat + hoarse + cough No pleuritic pain No wheezing + nasal congestion + post-nasal drainage No sinus pain/pressure No itchy/red eyes ? right earache No hemoptysis No SOB No fever/chills No nausea No vomiting No abdominal pain No diarrhea No urinary symptoms No skin rash + fatigue + myalgias + headache Used OTC meds without relief   Physical Exam Triage Vital Signs ED Triage Vitals  Enc Vitals Group     BP 01/21/18 1206 137/90     Pulse Rate  01/21/18 1206 79     Resp 01/21/18 1206 18     Temp 01/21/18 1206 97.8 F (36.6 C)     Temp Source 01/21/18 1206 Oral     SpO2 01/21/18 1206 97 %     Weight 01/21/18 1218 160 lb (72.6 kg)     Height 01/21/18 1218 5\' 1"  (1.549 m)     Head Circumference --      Peak Flow --      Pain Score 01/21/18 1208 0     Pain Loc --      Pain Edu? --      Excl. in GC? --    No data found.  Updated Vital Signs BP 137/90 (BP Location: Right Arm)   Pulse 79   Temp 97.8 F (36.6 C) (Oral)   Resp 18   Ht 5\' 1"  (1.549 m)   Wt 72.6 kg   SpO2 97%   BMI 30.23 kg/m   Visual Acuity Right Eye Distance:   Left Eye Distance:   Bilateral Distance:    Right Eye Near:   Left Eye Near:    Bilateral Near:     Physical Exam Nursing notes and Vital Signs reviewed. Appearance:  Patient appears stated age, and in no acute  distress Eyes:  Pupils are equal, round, and reactive to light and accomodation.  Extraocular movement is intact.  Conjunctivae are not inflamed  Ears:  Canals normal.  Tympanic membranes normal.  Nose:  Mildly congested turbinates.  No sinus tenderness.  Pharynx:  Normal Neck:  Supple.  Enlarged posterior/lateral nodes are palpated bilaterally, tender to palpation on the left.   Lungs:  Clear to auscultation.  Breath sounds are equal.  Moving air well. Heart:  Regular rate and rhythm without murmurs, rubs, or gallops.  Abdomen:  Nontender without masses or hepatosplenomegaly.  Bowel sounds are present.  No CVA or flank tenderness.  Extremities:  No edema.  Skin:  No rash present.    UC Treatments / Results  Labs (all labs ordered are listed, but only abnormal results are displayed) Labs Reviewed - No data to display  EKG None  Radiology No results found.  Procedures Procedures (including critical care time)  Medications Ordered in UC Medications - No data to display  Initial Impression / Assessment and Plan / UC Course  I have reviewed the triage vital signs and the nursing notes.  Pertinent labs & imaging results that were available during my care of the patient were reviewed by me and considered in my medical decision making (see chart for details).    There is no evidence of bacterial infection today.  Treat symptomatically for now  Prescription written for Benzonatate (Tessalon) to take at bedtime for night-time cough.  Followup with Family Doctor if not improved in about 10 days.   Final Clinical Impressions(s) / UC Diagnoses   Final diagnoses:  Viral URI with cough     Discharge Instructions     Take plain guaifenesin (1200mg  extended release tabs such as Mucinex) twice daily, with plenty of water, for cough and congestion.  Get adequate rest.   May use Afrin nasal spray (or generic oxymetazoline) each morning for about 5 days and then discontinue.  Also  recommend using saline nasal spray several times daily and saline nasal irrigation (AYR is a common brand).   Try warm salt water gargles for sore throat.  Stop all antihistamines for now, and other non-prescription cough/cold preparations. May take Tylenol as needed  for fever, headache, etc. May take Delsym Cough Suppressant with Tessalon at bedtime for nighttime cough.  Begin Doxycycline if not improving about one week or if persistent fever develops      ED Prescriptions    Medication Sig Dispense Auth. Provider   benzonatate (TESSALON) 200 MG capsule Take one cap by mouth at bedtime as needed for cough.  May repeat in 4 to 6 hours 15 capsule Lattie HawBeese, Stephen A, MD   doxycycline (VIBRAMYCIN) 100 MG capsule Take 1 capsule (100 mg total) by mouth 2 (two) times daily. Take with food (Rx void after 01/30/18) 14 capsule Lattie HawBeese, Stephen A, MD         Lattie HawBeese, Stephen A, MD 01/21/18 [id number]

## 2018-02-18 ENCOUNTER — Encounter: Payer: Self-pay | Admitting: Emergency Medicine

## 2018-02-18 ENCOUNTER — Other Ambulatory Visit: Payer: Self-pay

## 2018-02-18 ENCOUNTER — Emergency Department (INDEPENDENT_AMBULATORY_CARE_PROVIDER_SITE_OTHER)
Admission: EM | Admit: 2018-02-18 | Discharge: 2018-02-18 | Disposition: A | Discharge status: Home / Self Care | Payer: Medicare Other | Source: Home / Self Care | Attending: Emergency Medicine | Admitting: Emergency Medicine

## 2018-02-18 ENCOUNTER — Emergency Department (INDEPENDENT_AMBULATORY_CARE_PROVIDER_SITE_OTHER): Payer: Medicare Other

## 2018-02-18 DIAGNOSIS — M25561 Pain in right knee: Secondary | ICD-10-CM

## 2018-02-18 DIAGNOSIS — M1711 Unilateral primary osteoarthritis, right knee: Secondary | ICD-10-CM

## 2018-02-18 MED ORDER — DICLOFENAC SODIUM 1 % TD GEL: 2.0000 g | Freq: Two times a day (BID) | TRANSDERMAL | 1 refills | Status: AC

## 2018-02-18 NOTE — ED Provider Notes (Signed)
[facility]    CSN: [id number] Arrival date & time: 02/18/18  1122     History   Chief Complaint Chief Complaint  Patient presents with  . Knee Pain    HPI Sheena Allen is a 78 y.o. female.   HPI Patient states that yesterday she was up on her feet all day.  She subsequently developed pain and swelling involving the right knee.  She now has difficulty walking secondary to knee discomfort.  She had significant fluid over the knee last night which is better today. Past Medical History:  Diagnosis Date  . Hyperlipemia   . Hypertension     Patient Active Problem List   Diagnosis Date Noted  . HYPERLIPIDEMIA 11/11/2009  . HYPERTENSION 11/11/2009    Past Surgical History:  Procedure Laterality Date  . CHOLECYSTECTOMY    . HEMORROIDECTOMY      OB History   No obstetric history on file.      Home Medications    Prior to Admission medications   Medication Sig Start Date End Date Taking? Authorizing Provider  aspirin 81 MG tablet Take 81 mg by mouth daily.    [provider]  atorvastatin (LIPITOR) 40 MG tablet Take 40 mg by mouth daily.    [provider]  benazepril (LOTENSIN) 40 MG tablet Take 40 mg by mouth daily.    [provider]  clopidogrel (PLAVIX) 75 MG tablet Take 75 mg by mouth daily.    [provider]  diclofenac sodium (VOLTAREN) 1 % GEL Apply 2 g topically 2 (two) times daily. 02/18/18   Collene Gobbleaub, Sofia Jaquith A, MD  diltiazem (DILACOR XR) 180 MG 24 hr capsule Take 180 mg by mouth daily.    [provider]  losartan (COZAAR) 100 MG tablet Take 100 mg by mouth daily.    [provider]  simvastatin (ZOCOR) 20 MG tablet Take 20 mg by mouth every evening.    [provider]  triamcinolone (KENALOG) 0.1 % paste Place thin layer on mouth ulcers four times daily 11/27/14   Lattie HawBeese, Stephen A, MD  triamcinolone cream (KENALOG) 0.1 % Apply 1 application topically 2 (two) times daily. 10/22/17   Lurene ShadowPhelps,  Erin O, PA-C    Family History Family History  Problem Relation Age of Onset  . Heart attack Mother   . Heart attack Father   . Cancer Sister        breast  . Cancer Brother        lung    Social History Social History   Tobacco Use  . Smoking status: Never Smoker  . Smokeless tobacco: Never Used  Substance Use Topics  . Alcohol use: No  . Drug use: No     Allergies   Codeine   Review of Systems Review of Systems  Constitutional: Negative.   Cardiovascular: Negative.   Musculoskeletal: Positive for gait problem and joint swelling.     Physical Exam Triage Vital Signs ED Triage Vitals  Enc Vitals Group     BP 02/18/18 1138 (!) 152/80     Pulse Rate 02/18/18 1138 84     Resp --      Temp 02/18/18 1138 97.7 F (36.5 C)     Temp Source 02/18/18 1138 Oral     SpO2 02/18/18 1138 96 %     Weight 02/18/18 1139 160 lb (72.6 kg)     Height 02/18/18 1139 5\' 1"  (1.549 m)     Head Circumference --  Peak Flow --      Pain Score 02/18/18 1139 7     Pain Loc --      Pain Edu? --      Excl. in GC? --    No data found.  Updated Vital Signs BP (!) 152/80 (BP Location: Right Arm)   Pulse 84   Temp 97.7 F (36.5 C) (Oral)   Ht 5\' 1"  (1.549 m)   Wt 72.6 kg   SpO2 96%   BMI 30.23 kg/m   Visual Acuity Right Eye Distance:   Left Eye Distance:   Bilateral Distance:    Right Eye Near:   Left Eye Near:    Bilateral Near:     Physical Exam Constitutional:      Appearance: Normal appearance.  HENT:     Head: Normocephalic.     Mouth/Throat:     Mouth: Mucous membranes are dry.  Musculoskeletal:        General: Swelling and tenderness present.     Comments: There are significant degenerative changes involving the knee.  There is limited flexion extension secondary to significant fluid on the knee.  Neurological:     Mental Status: She is alert.      UC Treatments / Results  Labs (all labs ordered are listed, but only abnormal results are  displayed) Labs Reviewed - No data to display  EKG None  Radiology Dg Knee Complete 4 Views Right  Result Date: 02/18/2018 CLINICAL DATA:  Right knee pain and swelling 1 day.  No injury EXAM: RIGHT KNEE - COMPLETE 4+ VIEW COMPARISON:  None. FINDINGS: Mild tricompartmental osteoarthritis. Moderate-sized joint effusion. No acute fracture dislocation. IMPRESSION: No acute fracture. Mild osteoarthritis and moderate joint effusion. Electronically Signed   By: Elberta Fortis M.D.   On: 02/18/2018 12:40    Procedures Procedures (including critical care time)  Medications Ordered in UC Medications - No data to display  Initial Impression / Assessment and Plan / UC Course  I have reviewed the triage vital signs and the nursing notes.  Pertinent labs & imaging results that were available during my care of the patient were reviewed by me and considered in my medical decision making (see chart for details). X-ray shows osteoarthritis with moderate effusion.  Will treat with topical diclofenac or capsaicin topical.  They will follow-up with Dr. Denyse Amass if persistent problems to consider injection.  They will use ice for swelling relief.     Final Clinical Impressions(s) / UC Diagnoses   Final diagnoses:  Acute pain of right knee  Primary osteoarthritis of right knee     Discharge Instructions     Apply ice every 4 hours. Apply diclofenac gel twice a day. Follow-up Dr. Denyse Amass if persistent symptoms.    ED Prescriptions    Medication Sig Dispense Auth. Provider   diclofenac sodium (VOLTAREN) 1 % GEL Apply 2 g topically 2 (two) times daily. 100 g Collene Gobble, MD     Controlled Substance Prescriptions [place] Controlled Substance Registry consulted? Not Applicable   Collene Gobble, MD 02/18/18 1423

## 2018-02-18 NOTE — Discharge Instructions (Signed)
Apply ice every 4 hours. Apply diclofenac gel twice a day. Follow-up Dr. Denyse Amassorey if persistent symptoms.

## 2018-02-18 NOTE — ED Triage Notes (Signed)
RT knee pain and swelling started last night, denies trauma or injury

## 2020-01-16 IMAGING — DX DG KNEE COMPLETE 4+V*R*
4 series · 4 of 4 positions shown · non-contrast
Comparison: None.

CLINICAL DATA: Right knee pain and swelling 1 day.  No injury

EXAM:
RIGHT KNEE - COMPLETE 4+ VIEW

[knee ap]
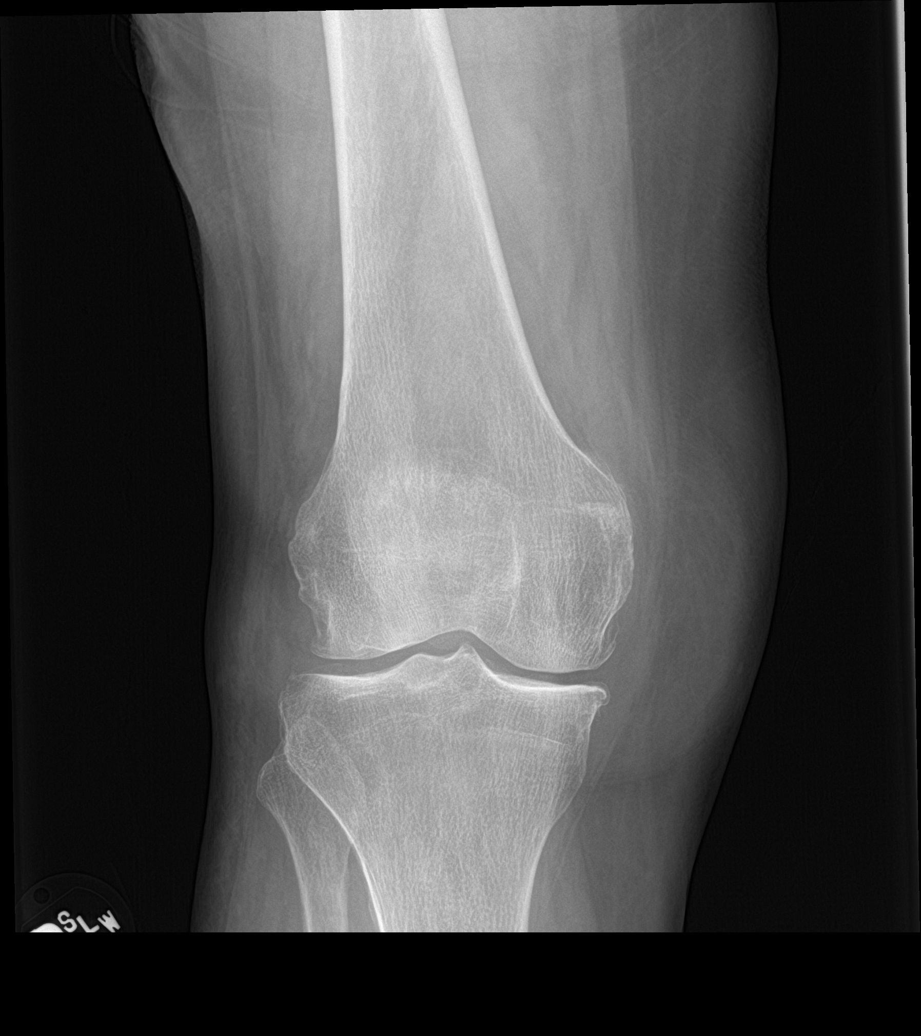

[knee lat]
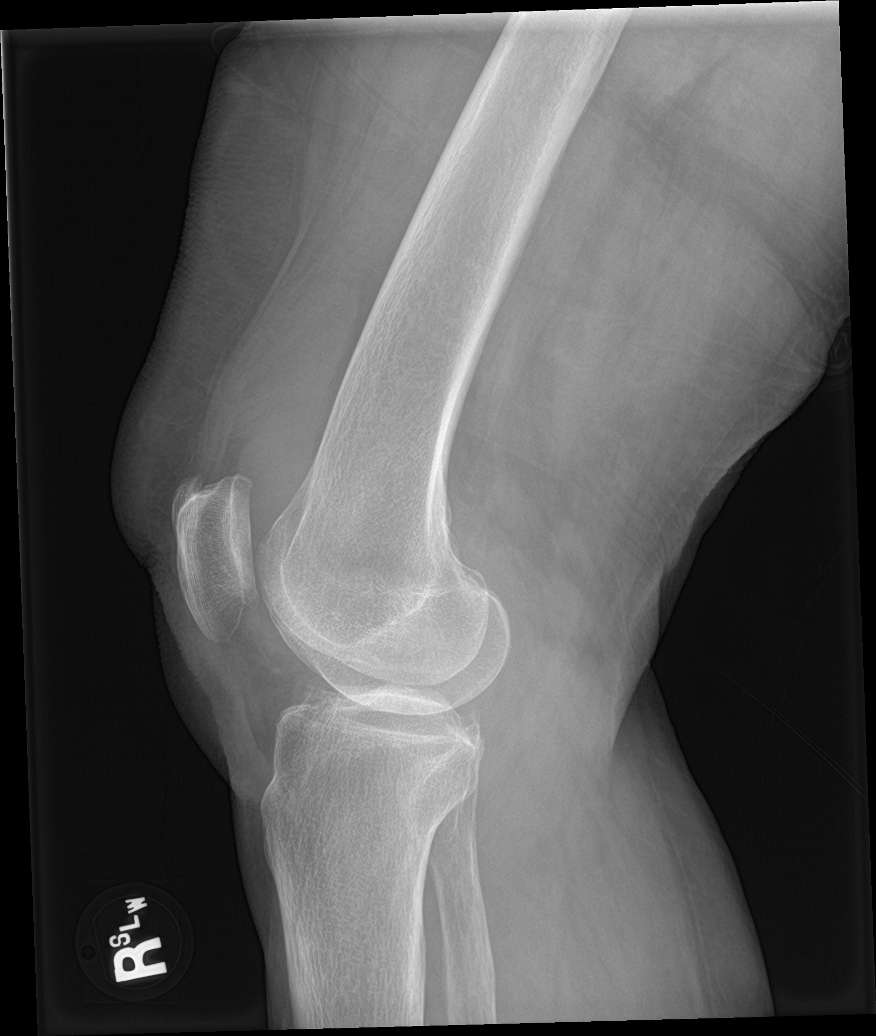

[knee obl (1 of 2)]
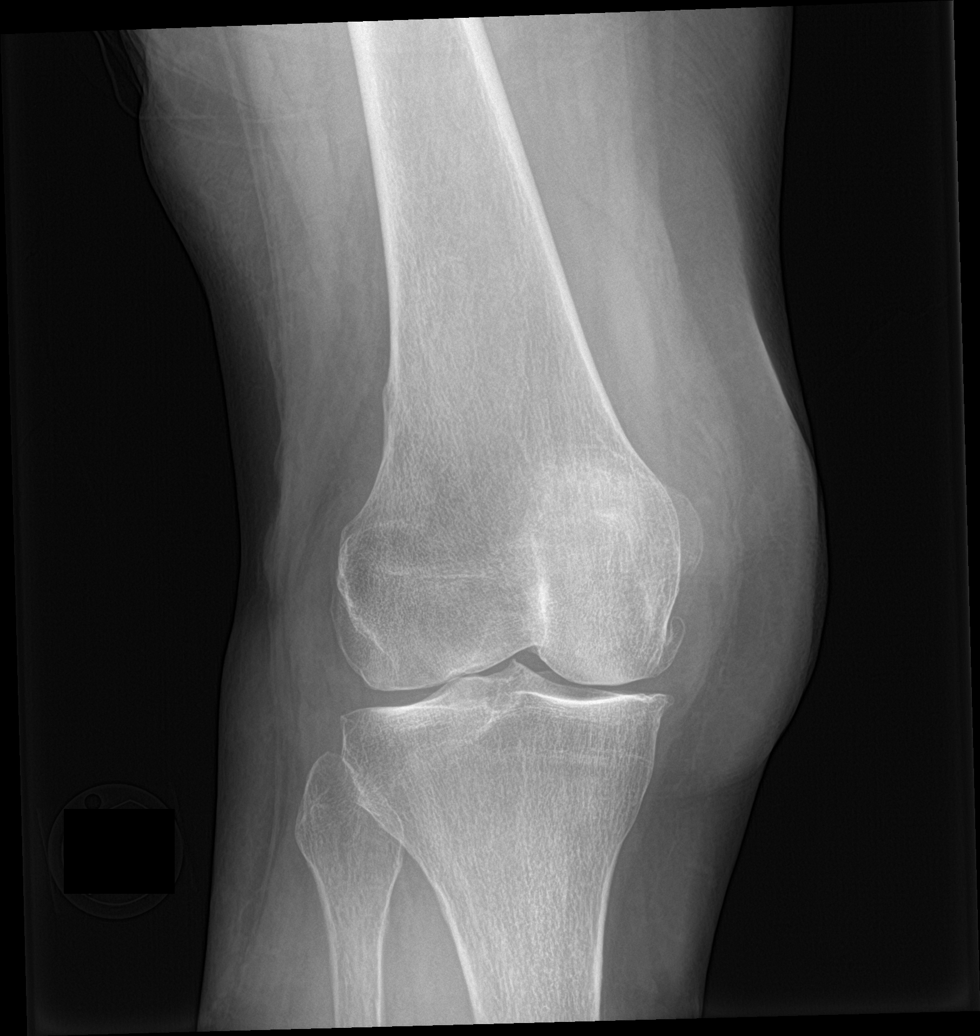

[knee obl (2 of 2)]
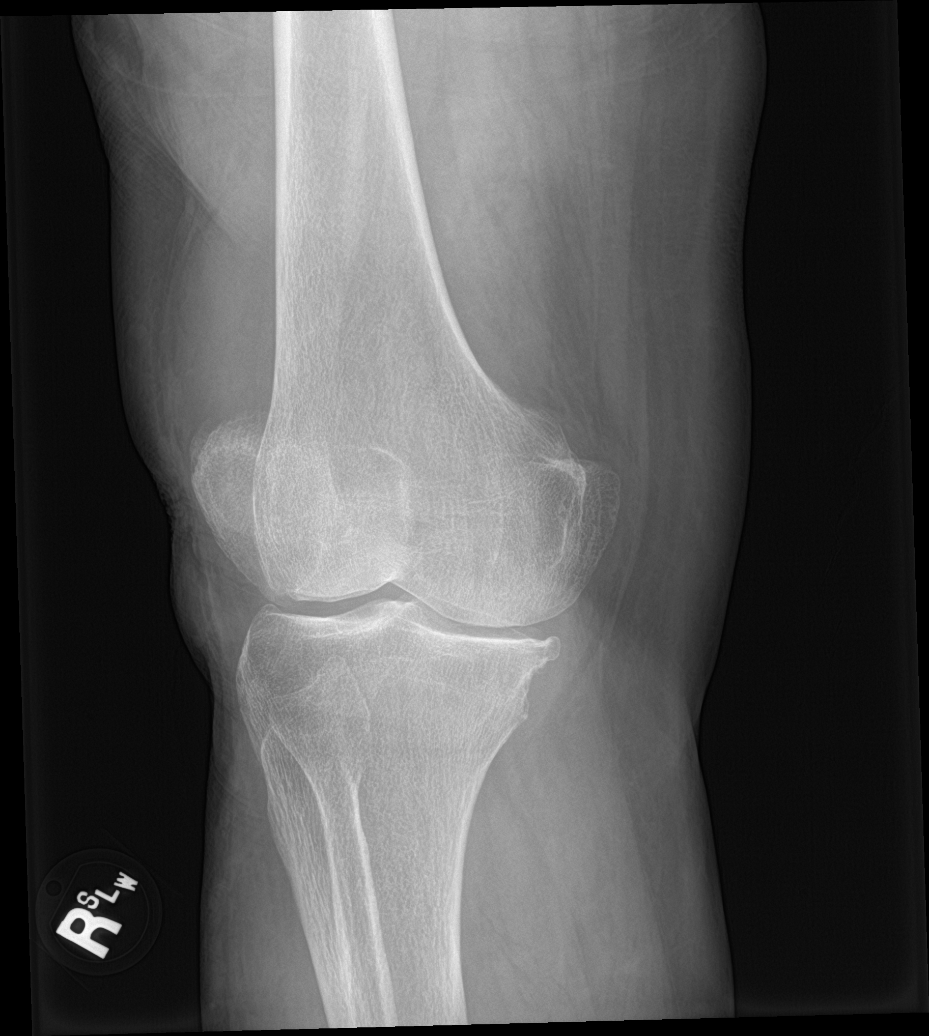

[4 of 4 positions shown; findings below may reference images not displayed]

FINDINGS: Mild tricompartmental osteoarthritis. Moderate-sized joint effusion.
No acute fracture dislocation.
IMPRESSION: No acute fracture.

Mild osteoarthritis and moderate joint effusion.
